# Patient Record
Sex: Male | Born: 2005
Health system: Southern US, Community
[De-identification: ages and names within clinical notes are randomized; demographics above are authoritative.]

## PROBLEM LIST (undated history)

## (undated) DIAGNOSIS — J45909 Unspecified asthma, uncomplicated: Secondary | ICD-10-CM

## (undated) DIAGNOSIS — N390 Urinary tract infection, site not specified: Secondary | ICD-10-CM

## (undated) DIAGNOSIS — J324 Chronic pansinusitis: Secondary | ICD-10-CM

---
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
## 2024-03-11 ENCOUNTER — Ambulatory Visit (INDEPENDENT_AMBULATORY_CARE_PROVIDER_SITE_OTHER): Admitting: Otolaryngology

## 2024-03-11 ENCOUNTER — Encounter (INDEPENDENT_AMBULATORY_CARE_PROVIDER_SITE_OTHER): Payer: Self-pay | Admitting: Otolaryngology

## 2024-03-11 VITALS — BP 127/76 | HR 75 | Ht 69.0 in | Wt 160.0 lb

## 2024-03-11 DIAGNOSIS — J342 Deviated nasal septum: Secondary | ICD-10-CM | POA: Diagnosis not present

## 2024-03-11 DIAGNOSIS — J324 Chronic pansinusitis: Secondary | ICD-10-CM

## 2024-03-11 DIAGNOSIS — J310 Chronic rhinitis: Secondary | ICD-10-CM

## 2024-03-11 DIAGNOSIS — R0981 Nasal congestion: Secondary | ICD-10-CM | POA: Diagnosis not present

## 2024-03-11 DIAGNOSIS — J343 Hypertrophy of nasal turbinates: Secondary | ICD-10-CM

## 2024-03-12 ENCOUNTER — Telehealth (INDEPENDENT_AMBULATORY_CARE_PROVIDER_SITE_OTHER): Payer: Self-pay

## 2024-03-12 DIAGNOSIS — J342 Deviated nasal septum: Secondary | ICD-10-CM | POA: Insufficient documentation

## 2024-03-12 DIAGNOSIS — J343 Hypertrophy of nasal turbinates: Secondary | ICD-10-CM | POA: Insufficient documentation

## 2024-03-12 DIAGNOSIS — J324 Chronic pansinusitis: Secondary | ICD-10-CM | POA: Insufficient documentation

## 2024-03-12 NOTE — Telephone Encounter (Signed)
 Patient called and said he would need to have his scan at [facility] due to cost.  [facility] Fax [phone]

## 2024-03-12 NOTE — Progress Notes (Signed)
 CC: Recurrent sinusitis, chronic nasal congestion  HPI:  Samuel Sutton is a 18 y.o. male who presents today complaining of recurrent sinusitis for the past year.  His symptoms include headaches, facial pain and pressure, nasal congestion, and nasal drainage.  He was treated with 4 courses of antibiotics over the past year.  He has a history of seasonal allergies.  He uses Claritin and Flonase as needed.  He has no previous ENT surgery.  He has a history of childhood asthma.  He has no recent sinus CT scan.  Past medical history: Recurrent sinusitis  Past surgical history: None.  History reviewed. No pertinent family history.  Social History:  has no history on file for tobacco use, alcohol use, and drug use.  Allergies: No Known Allergies  Prior to Admission medications   Medication Sig Start Date End Date Taking? Authorizing Provider  adapalene (DIFFERIN) 0.1 % gel Apply topically at bedtime.   Yes [provider]  ketoconazole (NIZORAL) 2 % shampoo APPLY AS SHAMPOO 2-5 TIMES WEEKLY UTD 08/17/19  Yes [provider]    Blood pressure 127/76, pulse 75, height 5\' 9"  (1.753 m), weight 160 lb (72.6 kg), SpO2 97%. Exam: General: Communicates without difficulty, well nourished, no acute distress. Head: Normocephalic, no evidence injury, no tenderness, facial buttresses intact without stepoff. Face/sinus: No tenderness to palpation and percussion. Facial movement is normal and symmetric. Eyes: PERRL, EOMI. No scleral icterus, conjunctivae clear. Neuro: CN II exam reveals vision grossly intact.  No nystagmus at any point of gaze. Ears: Auricles well formed without lesions.  Ear canals are intact without mass or lesion.  No erythema or edema is appreciated.  The TMs are intact without fluid. Nose: External evaluation reveals normal support and skin without lesions.  Dorsum is intact.  Anterior rhinoscopy reveals congested mucosa over anterior aspect of inferior turbinates and  deviated septum.  No purulence noted. Oral:  Oral cavity and oropharynx are intact, symmetric, without erythema or edema.  Mucosa is moist without lesions. Neck: Full range of motion without pain.  There is no significant lymphadenopathy.  No masses palpable.  Thyroid bed within normal limits to palpation.  Parotid glands and submandibular glands equal bilaterally without mass.  Trachea is midline. Neuro:  CN 2-12 grossly intact.   Procedure:  Flexible Nasal Endoscopy: Description: Risks, benefits, and alternatives of flexible endoscopy were explained to the patient.  Specific mention was made of the risk of throat numbness with difficulty swallowing, possible bleeding from the nose and mouth, and pain from the procedure.  The patient gave oral consent to proceed.  The flexible scope was inserted into the right nasal cavity.  Endoscopy of the interior nasal cavity, superior, inferior, and middle meatus was performed. The sphenoid-ethmoid recess was examined. Edematous mucosa was noted.  No polyp, mass, or lesion was appreciated. Nasal septal deviation noted. Olfactory cleft was clear.  Nasopharynx was clear.  Turbinates were hypertrophied but without mass.  The procedure was repeated on the contralateral side with similar findings.  The patient tolerated the procedure well.   Assessment: 1.  Chronic rhinitis with nasal mucosal congestion, nasal septal deviation, and bilateral inferior turbinate hypertrophy. 2.  History of recurrent sinusitis.  However, no acute infection or purulent drainage is noted today.  Plan: 1.  The physical exam and nasal endoscopy findings are reviewed with the patient. 2.  Flonase nasal spray 2 sprays each nostril daily.  The importance of consistent daily use is discussed. 3.  Sinus CT scan  to evaluate for possible chronic sinusitis. 4.  The patient will return for reevaluation in 4 weeks.  Damali Broadfoot W Ayyub Krall 03/12/2024, 10:32 AM

## 2024-04-14 ENCOUNTER — Ambulatory Visit (INDEPENDENT_AMBULATORY_CARE_PROVIDER_SITE_OTHER): Admitting: Otolaryngology

## 2024-04-14 ENCOUNTER — Ambulatory Visit
Admission: RE | Admit: 2024-04-14 | Discharge: 2024-04-14 | Disposition: A | Discharge status: Home / Self Care | Source: Ambulatory Visit | Attending: Otolaryngology | Admitting: Otolaryngology

## 2024-04-21 HISTORY — PX: WISDOM TOOTH EXTRACTION: SHX21

## 2024-04-28 ENCOUNTER — Encounter (INDEPENDENT_AMBULATORY_CARE_PROVIDER_SITE_OTHER): Payer: Self-pay | Admitting: Otolaryngology

## 2024-04-28 ENCOUNTER — Ambulatory Visit (INDEPENDENT_AMBULATORY_CARE_PROVIDER_SITE_OTHER): Admitting: Otolaryngology

## 2024-04-28 VITALS — BP 118/79 | HR 78 | Ht 69.0 in | Wt 165.0 lb

## 2024-04-28 DIAGNOSIS — J342 Deviated nasal septum: Secondary | ICD-10-CM | POA: Diagnosis not present

## 2024-04-28 DIAGNOSIS — J343 Hypertrophy of nasal turbinates: Secondary | ICD-10-CM

## 2024-04-28 DIAGNOSIS — J320 Chronic maxillary sinusitis: Secondary | ICD-10-CM

## 2024-04-28 DIAGNOSIS — J338 Other polyp of sinus: Secondary | ICD-10-CM | POA: Diagnosis not present

## 2024-04-29 DIAGNOSIS — J320 Chronic maxillary sinusitis: Secondary | ICD-10-CM | POA: Insufficient documentation

## 2024-04-29 DIAGNOSIS — J338 Other polyp of sinus: Secondary | ICD-10-CM | POA: Insufficient documentation

## 2024-04-29 NOTE — Progress Notes (Signed)
 Patient ID: Samuel Sutton, male   DOB: 10/22/2006, 18 y.o.   MRN: [id number]  Follow-up: Recurrent sinusitis, chronic nasal obstruction  HPI: The patient is a 18 year old male who returns today for his follow-up evaluation.  He was last seen in May 2025.  At that time, he was complaining of recurrent sinusitis and chronic nasal obstruction.  He has been experiencing recurrent headaches, facial pain and pressure, nasal obstruction, and nasal drainage.  He was treated with 4 courses of antibiotics, Flonase nasal spray, allergy medications, and nasal saline irrigation.  He also underwent a sinus CT scan.  The CT showed an 18 mm right maxillary polyp and a 16 mm left maxillary polyp.  Severe nasal septal deviation and bilateral inferior turbinate hypertrophy were also noted on the CT scan.  The patient returns today complaining of persistent symptoms.  He is still experiencing frequent facial pressure and chronic nasal obstruction.  Currently he denies any fever or visual change.  Exam: General: Communicates without difficulty, well nourished, no acute distress. Head: Normocephalic, no evidence injury, no tenderness, facial buttresses intact without stepoff. Face/sinus: No tenderness to palpation and percussion. Facial movement is normal and symmetric. Eyes: PERRL, EOMI. No scleral icterus, conjunctivae clear. Neuro: CN II exam reveals vision grossly intact.  No nystagmus at any point of gaze. Ears: Auricles well formed without lesions.  Ear canals are intact without mass or lesion.  No erythema or edema is appreciated.  The TMs are intact without fluid. Nose: External evaluation reveals normal support and skin without lesions.  Dorsum is intact.  Anterior rhinoscopy reveals congested mucosa over anterior aspect of inferior turbinates and deviated septum.  No purulence noted. Oral:  Oral cavity and oropharynx are intact, symmetric, without erythema or edema.  Mucosa is moist without lesions. Neck: Full range of  motion without pain.  There is no significant lymphadenopathy.  No masses palpable.  Thyroid bed within normal limits to palpation.  Parotid glands and submandibular glands equal bilaterally without mass.  Trachea is midline. Neuro:  CN 2-12 grossly intact.   Assessment: 1.  Bilateral chronic maxillary sinusitis and bilateral maxillary polyps/mucous retention cysts. 2.  Severe nasal septal deviation and bilateral inferior turbinate hypertrophy.  More than 95% of his nasal passageways are obstructed bilaterally.  He has not responded to medical treatment over the past year.  Plan: 1.  The physical exam findings and the CT images are extensively reviewed with the patient. 2.  Continue with Flonase nasal spray 2 sprays each nostril daily. 3.  Nasal saline irrigation daily. 4.  In light of his persistent symptoms, he will likely benefit from surgical intervention with septoplasty, bilateral inferior turbinate reduction, and bilateral maxillary antrostomy with polyp removal.  The risk, benefits, alternatives, and details of the procedures are extensively reviewed.  Questions are invited and answered. 5.  The patient would like to proceed with the procedures.  We will schedule the procedures in accordance with patient's schedule.

## 2024-05-18 ENCOUNTER — Encounter (HOSPITAL_BASED_OUTPATIENT_CLINIC_OR_DEPARTMENT_OTHER): Payer: Self-pay | Admitting: Otolaryngology

## 2024-05-18 ENCOUNTER — Other Ambulatory Visit: Payer: Self-pay

## 2024-05-25 ENCOUNTER — Other Ambulatory Visit (INDEPENDENT_AMBULATORY_CARE_PROVIDER_SITE_OTHER): Payer: Self-pay | Admitting: Otolaryngology

## 2024-05-25 ENCOUNTER — Ambulatory Visit (HOSPITAL_BASED_OUTPATIENT_CLINIC_OR_DEPARTMENT_OTHER): Admitting: Anesthesiology

## 2024-05-25 ENCOUNTER — Other Ambulatory Visit: Payer: Self-pay

## 2024-05-25 ENCOUNTER — Ambulatory Visit (HOSPITAL_BASED_OUTPATIENT_CLINIC_OR_DEPARTMENT_OTHER)
Admission: RE | Admit: 2024-05-25 | Discharge: 2024-05-25 | Disposition: A | Discharge status: Home / Self Care | Attending: Otolaryngology | Admitting: Otolaryngology

## 2024-05-25 ENCOUNTER — Encounter (HOSPITAL_BASED_OUTPATIENT_CLINIC_OR_DEPARTMENT_OTHER)
Admission: RE | Disposition: A | Discharge status: Home / Self Care | Payer: Self-pay | Source: Home / Self Care | Attending: Otolaryngology

## 2024-05-25 ENCOUNTER — Encounter (HOSPITAL_BASED_OUTPATIENT_CLINIC_OR_DEPARTMENT_OTHER): Payer: Self-pay | Admitting: Otolaryngology

## 2024-05-25 DIAGNOSIS — J338 Other polyp of sinus: Secondary | ICD-10-CM | POA: Insufficient documentation

## 2024-05-25 DIAGNOSIS — J320 Chronic maxillary sinusitis: Secondary | ICD-10-CM | POA: Diagnosis present

## 2024-05-25 DIAGNOSIS — J3489 Other specified disorders of nose and nasal sinuses: Secondary | ICD-10-CM | POA: Diagnosis not present

## 2024-05-25 DIAGNOSIS — J45909 Unspecified asthma, uncomplicated: Secondary | ICD-10-CM | POA: Diagnosis not present

## 2024-05-25 DIAGNOSIS — J343 Hypertrophy of nasal turbinates: Secondary | ICD-10-CM | POA: Diagnosis not present

## 2024-05-25 DIAGNOSIS — J342 Deviated nasal septum: Secondary | ICD-10-CM | POA: Diagnosis present

## 2024-05-25 HISTORY — DX: Chronic pansinusitis: J32.4

## 2024-05-25 HISTORY — PX: SINUS ENDO WITH FUSION: SHX5329

## 2024-05-25 HISTORY — DX: Urinary tract infection, site not specified: N39.0

## 2024-05-25 HISTORY — DX: Unspecified asthma, uncomplicated: J45.909

## 2024-05-25 SURGERY — SURGERY, PARANASAL SINUS, ENDOSCOPIC, WITH NASAL SEPTOPLASTY, TURBINOPLASTY, AND MAXILLARY SINUSOTOMY
Anesthesia: General | Site: Nose | Laterality: Bilateral

## 2024-05-25 MED ORDER — LIDOCAINE 2% (20 MG/ML) 5 ML SYRINGE
INTRAMUSCULAR | Status: DC | PRN
  Administered 2024-05-25: 100 mg via INTRAVENOUS

## 2024-05-25 MED ORDER — DEXAMETHASONE SODIUM PHOSPHATE 10 MG/ML IJ SOLN
INTRAMUSCULAR | Status: DC | PRN
  Administered 2024-05-25: 10 mg via INTRAVENOUS

## 2024-05-25 MED ORDER — ROCURONIUM BROMIDE 100 MG/10ML IV SOLN
INTRAVENOUS | Status: DC | PRN
Start: 2024-05-25 — End: 2024-05-25
  Administered 2024-05-25: 50 mg via INTRAVENOUS

## 2024-05-25 MED ORDER — BACITRACIN ZINC 500 UNIT/GM EX OINT
TOPICAL_OINTMENT | CUTANEOUS | Status: DC | PRN
  Administered 2024-05-25: 1 via TOPICAL

## 2024-05-25 MED ORDER — FENTANYL CITRATE (PF) 100 MCG/2ML IJ SOLN
25.0000 ug | INTRAMUSCULAR | Status: DC | PRN
  Administered 2024-05-25: 50 ug via INTRAVENOUS

## 2024-05-25 MED ORDER — OXYMETAZOLINE HCL 0.05 % NA SOLN
NASAL | Status: AC
  Filled 2024-05-25: qty 30

## 2024-05-25 MED ORDER — ONDANSETRON HCL 4 MG/2ML IJ SOLN
INTRAMUSCULAR | Status: DC | PRN
  Administered 2024-05-25: 4 mg via INTRAVENOUS

## 2024-05-25 MED ORDER — MIDAZOLAM HCL 2 MG/2ML IJ SOLN
INTRAMUSCULAR | Status: AC
  Filled 2024-05-25: qty 2

## 2024-05-25 MED ORDER — FENTANYL CITRATE (PF) 100 MCG/2ML IJ SOLN
INTRAMUSCULAR | Status: DC | PRN
  Administered 2024-05-25: 100 ug via INTRAVENOUS

## 2024-05-25 MED ORDER — OXYCODONE-ACETAMINOPHEN 5-325 MG PO TABS: 1.0000 | ORAL_TABLET | ORAL | 0 refills | Status: AC | PRN

## 2024-05-25 MED ORDER — PROPOFOL 10 MG/ML IV BOLUS
INTRAVENOUS | Status: AC
  Filled 2024-05-25: qty 20

## 2024-05-25 MED ORDER — CEFAZOLIN SODIUM 1 G IJ SOLR
INTRAMUSCULAR | Status: AC
  Filled 2024-05-25: qty 20

## 2024-05-25 MED ORDER — FENTANYL CITRATE (PF) 100 MCG/2ML IJ SOLN
INTRAMUSCULAR | Status: AC
Start: 2024-05-25 — End: 2024-05-25
  Filled 2024-05-25: qty 2

## 2024-05-25 MED ORDER — ONDANSETRON HCL 4 MG/2ML IJ SOLN
INTRAMUSCULAR | Status: AC
  Filled 2024-05-25: qty 2

## 2024-05-25 MED ORDER — FENTANYL CITRATE (PF) 100 MCG/2ML IJ SOLN
INTRAMUSCULAR | Status: AC
  Filled 2024-05-25: qty 2

## 2024-05-25 MED ORDER — LACTATED RINGERS IV SOLN: INTRAVENOUS | Status: DC

## 2024-05-25 MED ORDER — SUGAMMADEX SODIUM 200 MG/2ML IV SOLN
INTRAVENOUS | Status: DC | PRN
  Administered 2024-05-25: 200 mg via INTRAVENOUS

## 2024-05-25 MED ORDER — DROPERIDOL 2.5 MG/ML IJ SOLN: 0.6250 mg | Freq: Once | INTRAMUSCULAR | Status: DC | PRN

## 2024-05-25 MED ORDER — PROPOFOL 1000 MG/100ML IV EMUL
INTRAVENOUS | Status: AC
  Filled 2024-05-25: qty 200

## 2024-05-25 MED ORDER — MIDAZOLAM HCL 5 MG/5ML IJ SOLN
INTRAMUSCULAR | Status: DC | PRN
  Administered 2024-05-25: 2 mg via INTRAVENOUS

## 2024-05-25 MED ORDER — OXYCODONE HCL 5 MG PO TABS: 5.0000 mg | ORAL_TABLET | Freq: Once | ORAL | Status: DC | PRN

## 2024-05-25 MED ORDER — PROPOFOL 500 MG/50ML IV EMUL
INTRAVENOUS | Status: AC
Start: 2024-05-25 — End: 2024-05-25
  Filled 2024-05-25: qty 50

## 2024-05-25 MED ORDER — DEXMEDETOMIDINE HCL IN NACL 80 MCG/20ML IV SOLN
INTRAVENOUS | Status: DC | PRN
Start: 2024-05-25 — End: 2024-05-25
  Administered 2024-05-25: 10 ug via INTRAVENOUS

## 2024-05-25 MED ORDER — LIDOCAINE-EPINEPHRINE 1 %-1:100000 IJ SOLN
INTRAMUSCULAR | Status: DC | PRN
  Administered 2024-05-25: 5 mL

## 2024-05-25 MED ORDER — OXYMETAZOLINE HCL 0.05 % NA SOLN
NASAL | Status: DC | PRN
  Administered 2024-05-25: 1 via TOPICAL

## 2024-05-25 MED ORDER — ACETAMINOPHEN 500 MG PO TABS
1000.0000 mg | ORAL_TABLET | Freq: Once | ORAL | Status: AC
  Administered 2024-05-25: 1000 mg via ORAL

## 2024-05-25 MED ORDER — ACETAMINOPHEN 500 MG PO TABS
ORAL_TABLET | ORAL | Status: AC
  Filled 2024-05-25: qty 2

## 2024-05-25 MED ORDER — PROPOFOL 10 MG/ML IV BOLUS
INTRAVENOUS | Status: DC | PRN
  Administered 2024-05-25: 200 mg via INTRAVENOUS

## 2024-05-25 MED ORDER — ROCURONIUM BROMIDE 10 MG/ML (PF) SYRINGE
PREFILLED_SYRINGE | INTRAVENOUS | Status: AC
Start: 2024-05-25 — End: 2024-05-25
  Filled 2024-05-25: qty 20

## 2024-05-25 MED ORDER — PROPOFOL 500 MG/50ML IV EMUL
INTRAVENOUS | Status: DC | PRN
  Administered 2024-05-25: 150 ug/kg/min via INTRAVENOUS

## 2024-05-25 MED ORDER — OXYCODONE HCL 5 MG/5ML PO SOLN: 5.0000 mg | Freq: Once | ORAL | Status: DC | PRN

## 2024-05-25 MED ORDER — LIDOCAINE 2% (20 MG/ML) 5 ML SYRINGE
INTRAMUSCULAR | Status: AC
Start: 2024-05-25 — End: 2024-05-25
  Filled 2024-05-25: qty 15

## 2024-05-25 SURGICAL SUPPLY — 34 items
BLADE ROTATE RAD 40 4 M4 (BLADE) IMPLANT
BLADE TRICUT ROTATE M4 4 5PK (BLADE) ×1 IMPLANT
CANISTER SUC SOCK COL 7IN (MISCELLANEOUS) ×2 IMPLANT
CANISTER SUCT 1200ML W/VALVE (MISCELLANEOUS) ×2 IMPLANT
COAGULATOR SUCT 8FR VV (MISCELLANEOUS) ×1 IMPLANT
DEFOGGER MIRROR 1QT (MISCELLANEOUS) ×1 IMPLANT
DRSG NASOPORE 8CM (GAUZE/BANDAGES/DRESSINGS) IMPLANT
ELECTRODE REM PT RTRN 9FT ADLT (ELECTROSURGICAL) ×1 IMPLANT
GAUZE SPONGE 2X2 STRL 8-PLY (GAUZE/BANDAGES/DRESSINGS) ×1 IMPLANT
GLOVE BIO SURGEON STRL SZ7.5 (GLOVE) ×1 IMPLANT
GOWN STRL REUS W/ TWL LRG LVL3 (GOWN DISPOSABLE) ×2 IMPLANT
HEMOSTAT SURGICEL 2X14 (HEMOSTASIS) IMPLANT
IV NS 250ML BAXH (IV SOLUTION) IMPLANT
IV NS 500ML BAXH (IV SOLUTION) ×1 IMPLANT
NDL HYPO 25X1 1.5 SAFETY (NEEDLE) IMPLANT
NDL SPNL 25GX3.5 QUINCKE BL (NEEDLE) IMPLANT
NEEDLE HYPO 25X1 1.5 SAFETY (NEEDLE) ×1 IMPLANT
NEEDLE SPNL 25GX3.5 QUINCKE BL (NEEDLE) ×1 IMPLANT
NS IRRIG 1000ML POUR BTL (IV SOLUTION) ×1 IMPLANT
PACK BASIN DAY SURGERY FS (CUSTOM PROCEDURE TRAY) ×1 IMPLANT
PACK ENT DAY SURGERY (CUSTOM PROCEDURE TRAY) ×1 IMPLANT
SLEEVE SCD COMPRESS KNEE MED (STOCKING) ×1 IMPLANT
SPIKE FLUID TRANSFER (MISCELLANEOUS) IMPLANT
SPLINT NASAL AIRWAY SILICONE (MISCELLANEOUS) ×1 IMPLANT
SPONGE NEURO XRAY DETECT 1X3 (DISPOSABLE) ×1 IMPLANT
SUCTION TUBE FRAZIER 10FR DISP (SUCTIONS) IMPLANT
SUT PLAIN 4 0 ~~LOC~~ 1 (SUTURE) ×1 IMPLANT
SUT PROLENE 3 0 PS 2 (SUTURE) ×1 IMPLANT
SUT VIC AB 4-0 P-3 18XBRD (SUTURE) IMPLANT
SYR 50ML LL SCALE MARK (SYRINGE) ×1 IMPLANT
TOWEL GREEN STERILE FF (TOWEL DISPOSABLE) ×1 IMPLANT
TUBE CONNECTING 20X1/4 (TUBING) ×1 IMPLANT
TUBE SALEM SUMP 16F (TUBING) ×1 IMPLANT
YANKAUER SUCT BULB TIP NO VENT (SUCTIONS) ×1 IMPLANT

## 2024-05-25 NOTE — Anesthesia Procedure Notes (Signed)
 Procedure Name: Intubation Date/Time: 05/25/2024 11:28 AM  Performed by: Debarah Chiquita LABOR, CRNAPre-anesthesia Checklist: Patient identified, Emergency Drugs available, Suction available and Patient being monitored Patient Re-evaluated:Patient Re-evaluated prior to induction Oxygen Delivery Method: Circle system utilized Preoxygenation: Pre-oxygenation with 100% oxygen Induction Type: IV induction Ventilation: Mask ventilation without difficulty Laryngoscope Size: Mac and 4 Grade View: Grade I Tube type: Oral Tube size: 7.5 mm Number of attempts: 1 Airway Equipment and Method: Stylet and Bite block Placement Confirmation: ETT inserted through vocal cords under direct vision, positive ETCO2 and breath sounds checked- equal and bilateral Secured at: 22 cm Tube secured with: Tape Dental Injury: Teeth and Oropharynx as per pre-operative assessment

## 2024-05-25 NOTE — Op Note (Signed)
 DATE OF PROCEDURE: 05/25/2024  OPERATIVE REPORT   SURGEON: Daniel Moccasin, MD   PREOPERATIVE DIAGNOSES:  1. Severe nasal septal deviation.  2. Bilateral inferior turbinate hypertrophy.  3. Chronic nasal obstruction. 4. Bilateral chronic maxillary sinusitis and polyposis.  POSTOPERATIVE DIAGNOSES:  1. Severe nasal septal deviation.  2. Bilateral inferior turbinate hypertrophy.  3. Chronic nasal obstruction. 4. Bilateral chronic maxillary sinusitis and polyposis.  PROCEDURE PERFORMED:  1. Septoplasty.  2. Bilateral partial inferior turbinate resection.  3. Bilateral endoscopic maxillary antrostomy with polyp removal.  ANESTHESIA: General endotracheal tube anesthesia.   COMPLICATIONS: None.   ESTIMATED BLOOD LOSS: 150 mL.   INDICATION FOR PROCEDURE: Samuel Sutton is a 18 y.o. male with a history of recurrent sinusitis and chronic nasal obstruction. He has been experiencing recurrent headaches, facial pain and pressure, nasal obstruction, and nasal drainage. He was treated with 4 courses of antibiotics, Flonase nasal spray, allergy medications, and nasal saline irrigation. He also underwent a sinus CT scan. The CT showed an 18 mm right maxillary polyp and a 16 mm left maxillary polyp. Severe nasal septal deviation and bilateral inferior turbinate hypertrophy were also noted on the CT scan.  Based on the above findings, the decision was made for the patient to undergo the above-stated procedures. The risks, benefits, alternatives, and details of the procedures were discussed with the patient. Questions were invited and answered. Informed consent was obtained.   DESCRIPTION OF PROCEDURE: The patient was taken to the operating room and placed supine on the operating table. General endotracheal tube anesthesia was administered by the anesthesiologist. The patient was positioned, and prepped and draped in the standard fashion for nasal surgery. Pledgets soaked with Afrin were placed in both nasal  cavities for decongestion. The pledgets were subsequently removed.   Examination of the nasal cavity revealed a severe nasal septal deviation. 1% lidocaine  with 1:100,000 epinephrine  was injected onto the nasal septum bilaterally. A hemitransfixion incision was made on the left side. The mucosal flap was carefully elevated on the left side. A cartilaginous incision was made 1 cm superior to the caudal margin of the nasal septum. Mucosal flap was also elevated on the right side in the similar fashion. It should be noted that due to the severe septal deviation, the deviated portion of the cartilaginous and bony septum had to be removed in piecemeal fashion. Once the deviated portions were removed, a straight midline septum was achieved. The septum was then quilted with 4-0 plain gut sutures. The hemitransfixion incision was closed with interrupted 4-0 chromic sutures.   The inferior one half of both hypertrophied inferior turbinate was crossclamped with a Kelly clamp. The inferior one half of each inferior turbinate was then resected with a pair of cross cutting scissors. Hemostasis was achieved with a suction cautery device.   Using a 0 endoscope, the left nasal cavity was examined. The uncinate process was resected with a freer elevator. The maxillary antrum was entered and enlarged using a combination of backbiter and microdebrider. Polypoid tissue was removed from the left maxillary sinus.  The sinus was copiously irrigated with saline solution.  The same procedure was repeated on the right side without exception.  Polypoid tissue was removed. Doyle splints were applied to the nasal septum.  The care of the patient was turned over to the anesthesiologist. The patient was awakened from anesthesia without difficulty. The patient was extubated and transferred to the recovery room in good condition.   OPERATIVE FINDINGS: Severe nasal septal deviation and bilateral inferior  turbinate hypertrophy.   Bilateral chronic maxillary sinusitis with polyposis.  SPECIMEN: Bilateral sinus contents.   FOLLOWUP CARE: The patient be discharged home once he is awake and alert. The patient will follow up in my office in 3 days for splint removal.   Iveliz Garay Dois Moccasin, MD

## 2024-05-25 NOTE — Anesthesia Postprocedure Evaluation (Signed)
 Anesthesia Post Note  Patient: Samuel Sutton  Procedure(s) Performed: SURGERY, PARANASAL SINUS, ENDOSCOPIC, WITH NASAL SEPTOPLASTY, TURBINOPLASTY, AND MAXILLARY SINUSOTOMY (Bilateral: Nose)     Patient location during evaluation: PACU Anesthesia Type: General Level of consciousness: awake and alert Pain management: pain level controlled Vital Signs Assessment: post-procedure vital signs reviewed and stable Respiratory status: spontaneous breathing, nonlabored ventilation and respiratory function stable Cardiovascular status: blood pressure returned to baseline Postop Assessment: no apparent nausea or vomiting Anesthetic complications: no   No notable events documented.  Last Vitals:  Vitals:   05/25/24 1315 05/25/24 1330  BP: 126/86 127/87  Pulse: 61 (!) 52  Resp: 12 16  Temp:    SpO2: 96% 100%    Last Pain:  Vitals:   05/25/24 1330  TempSrc:   PainSc: 1                  Vertell Row

## 2024-05-25 NOTE — H&P (Signed)
 Cc: Recurrent sinusitis, chronic nasal obstruction   HPI: The patient is a 18 year old male who returns today for his follow-up evaluation.  He was last seen in May 2025.  At that time, he was complaining of recurrent sinusitis and chronic nasal obstruction.  He has been experiencing recurrent headaches, facial pain and pressure, nasal obstruction, and nasal drainage.  He was treated with 4 courses of antibiotics, Flonase nasal spray, allergy medications, and nasal saline irrigation.  He also underwent a sinus CT scan.  The CT showed an 18 mm right maxillary polyp and a 16 mm left maxillary polyp.  Severe nasal septal deviation and bilateral inferior turbinate hypertrophy were also noted on the CT scan.  The patient returns today complaining of persistent symptoms.  He is still experiencing frequent facial pressure and chronic nasal obstruction.  Currently he denies any fever or visual change.   Exam: General: Communicates without difficulty, well nourished, no acute distress. Head: Normocephalic, no evidence injury, no tenderness, facial buttresses intact without stepoff. Face/sinus: No tenderness to palpation and percussion. Facial movement is normal and symmetric. Eyes: PERRL, EOMI. No scleral icterus, conjunctivae clear. Neuro: CN II exam reveals vision grossly intact.  No nystagmus at any point of gaze. Ears: Auricles well formed without lesions.  Ear canals are intact without mass or lesion.  No erythema or edema is appreciated.  The TMs are intact without fluid. Nose: External evaluation reveals normal support and skin without lesions.  Dorsum is intact.  Anterior rhinoscopy reveals congested mucosa over anterior aspect of inferior turbinates and deviated septum.  No purulence noted. Oral:  Oral cavity and oropharynx are intact, symmetric, without erythema or edema.  Mucosa is moist without lesions. Neck: Full range of motion without pain.  There is no significant lymphadenopathy.  No masses palpable.   Thyroid bed within normal limits to palpation.  Parotid glands and submandibular glands equal bilaterally without mass.  Trachea is midline. Neuro:  CN 2-12 grossly intact.    Assessment: 1.  Bilateral chronic maxillary sinusitis and bilateral maxillary polyps/mucous retention cysts. 2.  Severe nasal septal deviation and bilateral inferior turbinate hypertrophy.  More than 95% of his nasal passageways are obstructed bilaterally.  He has not responded to medical treatment over the past year.   Plan: 1.  The physical exam findings and the CT images are extensively reviewed with the patient. 2.  Continue with Flonase nasal spray 2 sprays each nostril daily. 3.  Nasal saline irrigation daily. 4.  In light of his persistent symptoms, he will likely benefit from surgical intervention with septoplasty, bilateral inferior turbinate reduction, and bilateral maxillary antrostomy with polyp removal.  The risk, benefits, alternatives, and details of the procedures are extensively reviewed.  Questions are invited and answered. 5.  The patient would like to proceed with the procedures.

## 2024-05-25 NOTE — Transfer of Care (Signed)
 Immediate Anesthesia Transfer of Care Note  Patient: Samuel Sutton  Procedure(s) Performed: SURGERY, PARANASAL SINUS, ENDOSCOPIC, WITH NASAL SEPTOPLASTY, TURBINOPLASTY, AND MAXILLARY SINUSOTOMY (Bilateral: Nose)  Patient Location: PACU  Anesthesia Type:General  Level of Consciousness: drowsy  Airway & Oxygen Therapy: Patient Spontanous Breathing and Patient connected to face mask oxygen  Post-op Assessment: Report given to RN and Post -op Vital signs reviewed and stable  Post vital signs: Reviewed and stable  Last Vitals:  Vitals Value Taken Time  BP 147/62 05/25/24 13:00  Temp    Pulse 82 05/25/24 12:59  Resp 13 05/25/24 13:01  SpO2 99 % 05/25/24 12:59  Vitals shown include unfiled device data.  Last Pain:  Vitals:   05/25/24 0953  TempSrc: Tympanic  PainSc: 0-No pain         Complications: No notable events documented.

## 2024-05-25 NOTE — Anesthesia Preprocedure Evaluation (Addendum)
 Anesthesia Evaluation  Patient identified by MRN, date of birth, ID band Patient awake    Reviewed: Allergy & Precautions, NPO status , Patient's Chart, lab work & pertinent test results  History of Anesthesia Complications Negative for: history of anesthetic complications  Airway Mallampati: II  TM Distance: >3 FB Neck ROM: Full    Dental no notable dental hx.    Pulmonary asthma    Pulmonary exam normal        Cardiovascular negative cardio ROS Normal cardiovascular exam     Neuro/Psych negative neurological ROS     GI/Hepatic negative GI ROS, Neg liver ROS,,,  Endo/Other  negative endocrine ROS    Renal/GU negative Renal ROS  negative genitourinary   Musculoskeletal negative musculoskeletal ROS (+)    Abdominal   Peds  Hematology negative hematology ROS (+)   Anesthesia Other Findings Chronic maxillary sinusitis      Deviated nasal septum      Hypertrophy of nasal turbinates      Polyp of nasal sinus    Reproductive/Obstetrics                              Anesthesia Physical Anesthesia Plan  ASA: 2  Anesthesia Plan: General   Post-op Pain Management: Tylenol  PO (pre-op)*   Induction: Intravenous  PONV Risk Score and Plan: 2 and Treatment may vary due to age or medical condition, Ondansetron , Dexamethasone , Midazolam , Propofol  infusion and TIVA  Airway Management Planned: Oral ETT  Additional Equipment: None  Intra-op Plan:   Post-operative Plan: Extubation in OR  Informed Consent: I have reviewed the patients History and Physical, chart, labs and discussed the procedure including the risks, benefits and alternatives for the proposed anesthesia with the patient or authorized representative who has indicated his/her understanding and acceptance.     Dental advisory given  Plan Discussed with: CRNA  Anesthesia Plan Comments:          Anesthesia Quick  Evaluation

## 2024-05-25 NOTE — Discharge Instructions (Addendum)
 POSTOPERATIVE INSTRUCTIONS FOR PATIENTS HAVING NASAL OR SINUS OPERATIONS ACTIVITY: Restrict activity at home for the first two days, resting as much as possible. Light activity is best. You may usually return to work within a week. You should refrain from nose blowing, strenuous activity, or heavy lifting greater than 20lbs for a total of one week after your operation.  If sneezing cannot be avoided, sneeze with your mouth open. DISCOMFORT: You may experience a dull headache and pressure along with nasal congestion and discharge. These symptoms may be worse during the first week after the operation but may last as long as two to four weeks.  Please take Tylenol  or the pain medication that has been prescribed for you. Do not take aspirin or aspirin containing medications since they may cause bleeding.  You may experience symptoms of post nasal drainage, nasal congestion, headaches and fatigue for two or three months after your operation.  BLEEDING: You may have some blood tinged nasal drainage for approximately two weeks after the operation.  The discharge will be worse for the first week.  Please call our office at [phone] or go to the nearest hospital emergency room if you experience any of the following: heavy, bright red blood from your nose or mouth that lasts longer than 15 minutes or coughing up or vomiting bright red blood or blood clots. GENERAL CONSIDERATIONS: A gauze dressing will be placed on your upper lip to absorb any drainage after the operation. You may need to change this several times a day.  If you do not have very much drainage, you may remove the dressing.  Remember that you may gently wipe your nose with a tissue and sniff in, but DO NOT blow your nose. Please keep all of your postoperative appointments.  Your final results after the operation will depend on proper follow-up.  The initial visit is usually 2 to 5 days after the operation.  During this visit, the remaining nasal  packing and internal septal splints will be removed.  Your nasal and sinus cavities will be cleaned.  During the second visit, your nasal and sinus cavities will be cleaned again. Have someone drive you to your first two postoperative appointments.  How you care for your nose after the operation will influence the results that you obtain.  You should follow all directions, take your medication as prescribed, and call our office [phone] with any problems or questions. You may be more comfortable sleeping with your head elevated on two pillows. Do not take any medications that we have not prescribed or recommended. WARNING SIGNS: if any of the following should occur, please call our office: Persistent fever greater than 102F. Persistent vomiting. Severe and constant pain that is not relieved by prescribed pain medication. Trauma to the nose. Rash or unusual side effects from any medicines.  Post Anesthesia Home Care Instructions  Activity: Get plenty of rest for the remainder of the day. A responsible individual must stay with you for 24 hours following the procedure.  For the next 24 hours, DO NOT: -Drive a car -Advertising copywriter -Drink alcoholic beverages -Take any medication unless instructed by your physician -Make any legal decisions or sign important papers.  Meals: Start with liquid foods such as gelatin or soup. Progress to regular foods as tolerated. Avoid greasy, spicy, heavy foods. If nausea and/or vomiting occur, drink only clear liquids until the nausea and/or vomiting subsides. Call your physician if vomiting continues.  Special Instructions/Symptoms: Your throat may feel dry or sore  from the anesthesia or the breathing tube placed in your throat during surgery. If this causes discomfort, gargle with warm salt water. The discomfort should disappear within 24 hours.  If you had a scopolamine patch placed behind your ear for the management of post- operative nausea and/or  vomiting:  1. The medication in the patch is effective for 72 hours, after which it should be removed.  Wrap patch in a tissue and discard in the trash. Wash hands thoroughly with soap and water. 2. You may remove the patch earlier than 72 hours if you experience unpleasant side effects which may include dry mouth, dizziness or visual disturbances. 3. Avoid touching the patch. Wash your hands with soap and water after contact with the patch.     Next dose of tylenol  may be taken at 4p

## 2024-05-26 ENCOUNTER — Encounter (HOSPITAL_BASED_OUTPATIENT_CLINIC_OR_DEPARTMENT_OTHER): Payer: Self-pay | Admitting: Otolaryngology

## 2024-05-26 LAB — SURGICAL PATHOLOGY

## 2024-05-28 ENCOUNTER — Encounter (INDEPENDENT_AMBULATORY_CARE_PROVIDER_SITE_OTHER): Payer: Self-pay | Admitting: Otolaryngology

## 2024-05-28 ENCOUNTER — Ambulatory Visit (INDEPENDENT_AMBULATORY_CARE_PROVIDER_SITE_OTHER): Admitting: Otolaryngology

## 2024-05-28 VITALS — BP 114/74 | HR 80

## 2024-05-28 DIAGNOSIS — J320 Chronic maxillary sinusitis: Secondary | ICD-10-CM

## 2024-05-28 DIAGNOSIS — J338 Other polyp of sinus: Secondary | ICD-10-CM

## 2024-05-30 NOTE — Progress Notes (Signed)
 Patient ID: Samuel Sutton, male   DOB: 2005-11-09, 18 y.o.   MRN: [id number]  Follow-up: Bilateral chronic maxillary sinusitis and polyposis  Procedure: Bilateral nasal/sinus debridement status post endoscopic sinus surgery.  Indication: The patient underwent bilateral endoscopic sinus surgery to treat his bilateral chronic maxillary sinusitis and polyposis on May 25, 2024.  The patient returns today complaining of significant nasal congestion and crusting.  He had a moderate amount of bleeding from the nasal cavities.  Currently the patient denies any facial pain, fever, or visual change.  Anesthesia: Topical Xylocaine  and Neo-Synephrine.  Description: The patient is placed upright in the exam chair. Both nasal cavities are sprayed with topical Xylocaine  and Neo-Synephrine.  A 0 rigid endoscope is used for the examination. The scope is first advanced past the right nostril into the right nasal cavity. A significant amount of crusting and blood clots are noted within the right nasal cavity and  the maxillary cavity.  The crusting and blood clots are removed with suction catheters and alligator forceps, which are inserted in parallel with the rigid endoscope.  After the debridement procedure, the maxillary antrum is noted to be patent.    The same procedure is then repeated on the left side without exception. Similar findings are again noted on the left. The patient tolerated the procedure well.  Follow up care: The patient is instructed to perform daily nasal saline irrigation.  The patient will return for re-evaluation in approximately 3 weeks.

## 2024-06-24 ENCOUNTER — Ambulatory Visit (INDEPENDENT_AMBULATORY_CARE_PROVIDER_SITE_OTHER): Admitting: Otolaryngology

## 2024-07-06 ENCOUNTER — Encounter (INDEPENDENT_AMBULATORY_CARE_PROVIDER_SITE_OTHER): Payer: Self-pay | Admitting: Otolaryngology

## 2024-07-06 ENCOUNTER — Ambulatory Visit (INDEPENDENT_AMBULATORY_CARE_PROVIDER_SITE_OTHER): Admitting: Otolaryngology

## 2024-07-06 VITALS — Ht 69.0 in | Wt 163.0 lb

## 2024-07-06 DIAGNOSIS — J338 Other polyp of sinus: Secondary | ICD-10-CM | POA: Diagnosis not present

## 2024-07-06 DIAGNOSIS — J320 Chronic maxillary sinusitis: Secondary | ICD-10-CM

## 2024-07-06 MED ORDER — AMOXICILLIN-POT CLAVULANATE 875-125 MG PO TABS
1.0000 | ORAL_TABLET | Freq: Two times a day (BID) | ORAL | 0 refills | Status: AC
Start: 2024-07-06 — End: 2024-07-13

## 2024-07-07 NOTE — Progress Notes (Signed)
 Patient ID: Samuel Sutton, male   DOB: [birth year], 18 y.o.   MRN: [id number]  Procedure: Bilateral nasal/sinus debridement status post endoscopic sinus surgery.  Indication: The patient previously underwent bilateral endoscopic sinus surgery to treat his bilateral chronic maxillary sinusitis and polyposis in August 2025.  The patient returns today complaining of increasing sinus drainage over the past week.  He had no significant bleeding from the nasal cavities.  His nasal breathing has improved.  Currently the patient denies any facial pain, fever, or visual change.  Anesthesia: Topical Xylocaine  and Neo-Synephrine.  Description: The patient is placed upright in the exam chair. Both nasal cavities are sprayed with topical Xylocaine  and Neo-Synephrine.  A 0 rigid endoscope is used for the examination. The scope is first advanced past the right nostril into the right nasal cavity. A moderate amount of crusting and mucoid drainage are noted within the right nasal cavity and the maxillary cavity.  The crusting and drainage are removed with suction catheters and alligator forceps, which are inserted in parallel with the rigid endoscope.  After the debridement procedure, the maxillary antrum is noted to be widely patent.    The same procedure is then repeated on the left side without exception. Similar findings are again noted on the left. The patient tolerated the procedure well.  Follow up care: The patient is instructed to continue daily nasal saline irrigation.  Augmentin  twice daily for 7 days.  The patient will return for re-evaluation in approximately 3 weeks.

## 2024-08-03 ENCOUNTER — Encounter (INDEPENDENT_AMBULATORY_CARE_PROVIDER_SITE_OTHER): Payer: Self-pay | Admitting: Otolaryngology

## 2024-08-03 ENCOUNTER — Ambulatory Visit (INDEPENDENT_AMBULATORY_CARE_PROVIDER_SITE_OTHER): Admitting: Otolaryngology

## 2024-08-03 VITALS — BP 112/70 | HR 70 | Temp 98.0°F | Ht 69.0 in | Wt 160.0 lb

## 2024-08-03 DIAGNOSIS — J338 Other polyp of sinus: Secondary | ICD-10-CM

## 2024-08-03 DIAGNOSIS — J320 Chronic maxillary sinusitis: Secondary | ICD-10-CM | POA: Diagnosis not present

## 2024-08-03 NOTE — Progress Notes (Signed)
 Patient ID: Samuel Sutton, male   DOB: 12-21-2005, 18 y.o.   MRN: [id number]  Procedure: Bilateral nasal/sinus debridement status post endoscopic sinus surgery.  Indication: The patient previously underwent bilateral endoscopic sinus surgery to treat his bilateral chronic maxillary sinusitis and polyposis in August 2025.  The patient was also treated with Augmentin  at his last visit 1 month ago.  The patient returns today reporting significant improvement in his sinonasal symptoms.  His nasal breathing has significantly improved.  He had no significant bleeding from the nasal cavities. Currently the patient denies any facial pain, fever, or visual change.  Anesthesia: Topical Xylocaine  and Neo-Synephrine.  Description: The patient is placed upright in the exam chair. Both nasal cavities are sprayed with topical Xylocaine  and Neo-Synephrine.  A 0 rigid endoscope is used for the examination. The scope is first advanced past the right nostril into the right nasal cavity. A small amount of crusting is noted within the right nasal cavity and the maxillary cavity.  The crusting is removed with suction catheters and alligator forceps, which are inserted in parallel with the rigid endoscope.  After the debridement procedure, the maxillary antrum is noted to be widely patent.    The same procedure is then repeated on the left side without exception. Similar findings are again noted on the left. The patient tolerated the procedure well.  Follow up care: The patient is instructed to continue as needed nasal saline irrigation.  Restart Flonase nasal spray 2 sprays each nostril daily.  The patient will return for re-evaluation in 6 months.

## 2025-02-01 ENCOUNTER — Ambulatory Visit (INDEPENDENT_AMBULATORY_CARE_PROVIDER_SITE_OTHER): Admitting: Otolaryngology
